# Patient Record
Sex: Female | Born: 1985 | Race: Asian | Hispanic: No | Marital: Married
Health system: Southern US, Community
[De-identification: ages and names within clinical notes are randomized; demographics above are authoritative.]

---

## 2014-01-30 ENCOUNTER — Other Ambulatory Visit (HOSPITAL_COMMUNITY)
Admission: RE | Admit: 2014-01-30 | Discharge: 2014-01-30 | Disposition: A | Discharge status: Home / Self Care | Payer: 59 | Source: Ambulatory Visit | Attending: Obstetrics & Gynecology | Admitting: Obstetrics & Gynecology

## 2014-01-30 DIAGNOSIS — Z01419 Encounter for gynecological examination (general) (routine) without abnormal findings: Secondary | ICD-10-CM | POA: Insufficient documentation

## 2014-01-30 DIAGNOSIS — Z113 Encounter for screening for infections with a predominantly sexual mode of transmission: Secondary | ICD-10-CM | POA: Insufficient documentation

## 2014-03-10 ENCOUNTER — Other Ambulatory Visit: Payer: Self-pay

## 2014-03-14 ENCOUNTER — Other Ambulatory Visit (HOSPITAL_COMMUNITY): Payer: Self-pay | Admitting: Obstetrics & Gynecology

## 2014-03-14 DIAGNOSIS — R772 Abnormality of alphafetoprotein: Secondary | ICD-10-CM

## 2014-03-23 ENCOUNTER — Ambulatory Visit (HOSPITAL_COMMUNITY)
Admission: RE | Admit: 2014-03-23 | Discharge: 2014-03-23 | Disposition: A | Discharge status: Home / Self Care | Payer: 59 | Source: Ambulatory Visit | Attending: Obstetrics & Gynecology | Admitting: Obstetrics & Gynecology

## 2014-03-23 ENCOUNTER — Encounter (HOSPITAL_COMMUNITY): Payer: Self-pay

## 2014-03-23 DIAGNOSIS — IMO0002 Reserved for concepts with insufficient information to code with codable children: Secondary | ICD-10-CM | POA: Insufficient documentation

## 2014-03-23 DIAGNOSIS — O351XX0 Maternal care for (suspected) chromosomal abnormality in fetus, not applicable or unspecified: Secondary | ICD-10-CM | POA: Insufficient documentation

## 2014-03-23 DIAGNOSIS — R772 Abnormality of alphafetoprotein: Secondary | ICD-10-CM

## 2014-03-23 DIAGNOSIS — O3510X0 Maternal care for (suspected) chromosomal abnormality in fetus, unspecified, not applicable or unspecified: Secondary | ICD-10-CM | POA: Insufficient documentation

## 2014-03-23 NOTE — Progress Notes (Signed)
Genetic Counseling  High-Risk Gestation Note  Appointment Date:  03/23/2014 Referred By: Sharon Sellerzan, Jennifer M, DO Date of Birth:  01-23-1986 Partner:  Elmer RampVishnu Prema    Pregnancy History: [id number] Estimated Date of Delivery: 08/28/14 Estimated Gestational Age: 6532w3d Attending: Particia NearingMartha Decker, MD   Ms. Keimya J[employer] Chase & Co[employer]riyadharshni Hellen and her husband, Mr. Elmer RampVishnu Prema, were seen for genetic counseling because of an increased risk for fetal Down syndrome based on Quad screen performed through [facility].  They were counseled regarding the Quad screen result and the associated 1 in 97 risk for fetal Down syndrome.  We reviewed chromosomes, nondisjunction, and the common features and variable prognosis of Down syndrome.  In addition, we reviewed the screen negative risks for trisomy 18 and ONTDs (1 in 10,000).  We also discussed other explanations for a screen positive result including: a gestational dating error, differences in maternal metabolism, and normal variation. They understand that this screening is not diagnostic for Down syndrome but provides a risk assessment.  Detailed ultrasound was performed at the time of today's visit. We reviewed that visualized fetal anatomy appeared normal. Complete ultrasound results reported separately. We discussed the benefits and limitations of ultrasound as a screening tool for Down syndrome.  They understand that ultrasound cannot diagnose or rule out Down syndrome or all chromosome/genetic conditions.   We reviewed available screening option of noninvasive prenatal screening (NIPS)/cell free DNA (cfDNA) testing.  They were counseled that screening tests are used to modify a patient's a priori risk for aneuploidy, typically based on age. This estimate provides a pregnancy specific risk assessment. We reviewed the benefits and limitations of each option. Specifically, we discussed the conditions for which NIPS screens, the detection rates, and false positive rates of each.  They were also counseled regarding diagnostic testing via amniocentesis. We reviewed the approximate 1 in 300-500 risk for complications for amniocentesis, including spontaneous pregnancy loss. After consideration of all the options, they declined NIPS and amniocentesis.  They understand that screening tests cannot rule out all birth defects or genetic syndromes.   Both family histories were reviewed and found to be noncontributory for birth defects, intellectual disability, recurrent pregnancy loss, and known genetic conditions. Without further information regarding the provided family history, an accurate genetic risk cannot be calculated. Further genetic counseling is warranted if more information is obtained.  Ms. Ralene OkRuma Priyadharshni Freimark denied exposure to environmental toxins or chemical agents. She denied the use of alcohol, tobacco or street drugs. She denied significant viral illnesses during the course of her pregnancy. Her medical and surgical histories were noncontributory.   I counseled this couple for approximately 40 minutes regarding the above risks and available options.   Quinn PlowmanKaren Roran Wegner, MS,  Certified Genetic Counselor 03/23/2014

## 2014-07-24 ENCOUNTER — Encounter (HOSPITAL_COMMUNITY): Payer: Self-pay

## 2015-01-26 ENCOUNTER — Encounter (HOSPITAL_COMMUNITY): Payer: Self-pay | Admitting: *Deleted

## 2015-07-30 IMAGING — US US OB DETAIL+14 WK
1 series · 12 of 28 positions shown · non-contrast
Comparison: none

[Series 1: us ob detail+14 wk · 0.18mm/px · 12 of 89 slices shown]
[im 4/89]
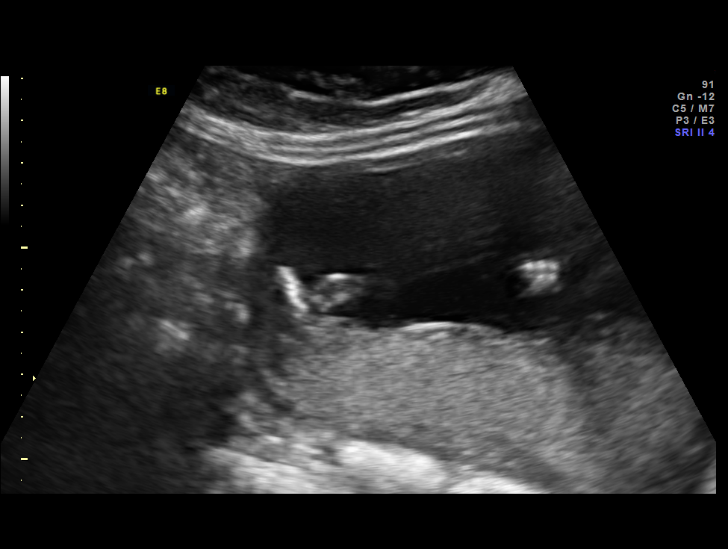
[im 10/89]
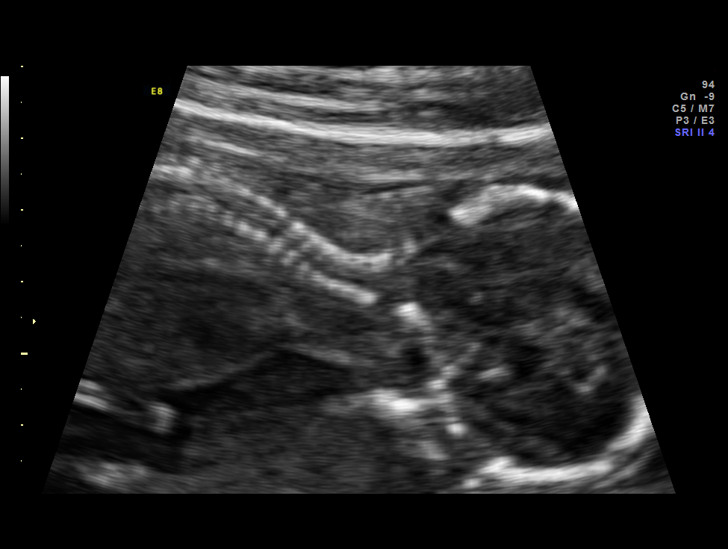
[im 17/89]
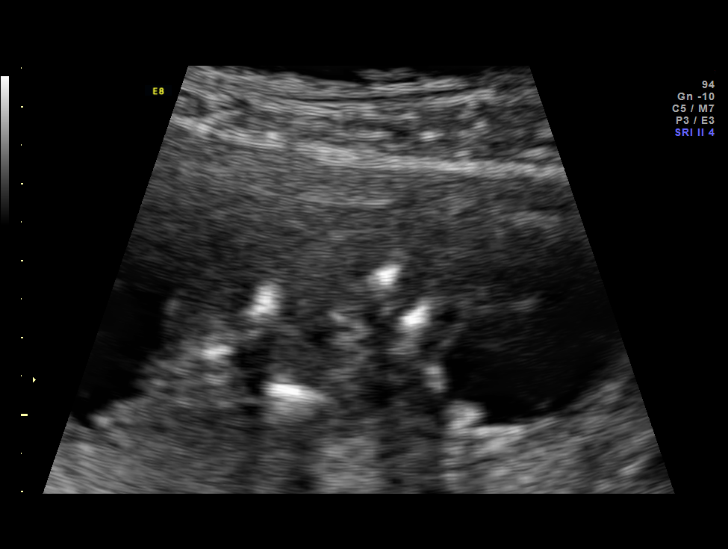
[im 27/89]
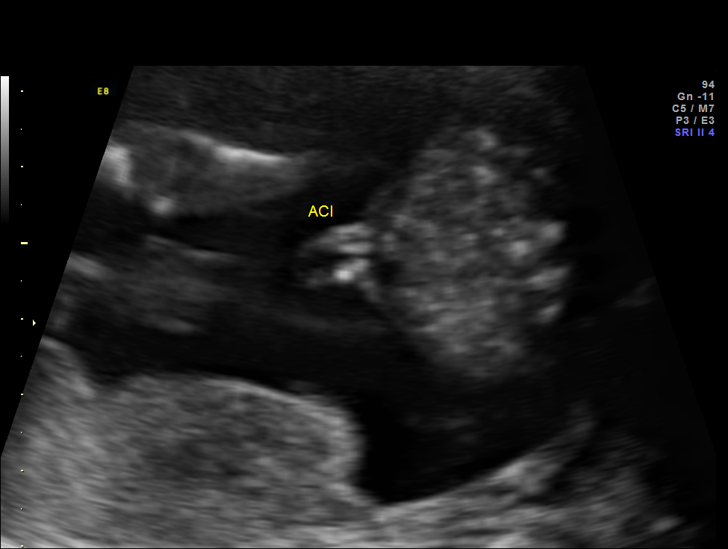
[im 33/89]
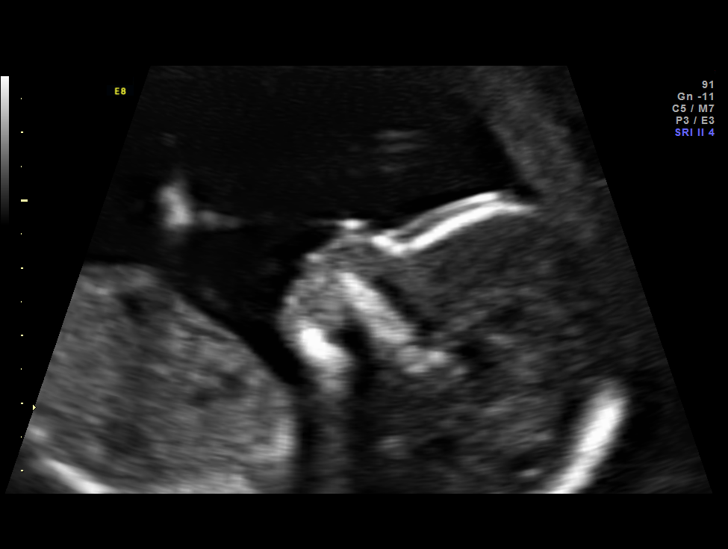
[im 40/89]
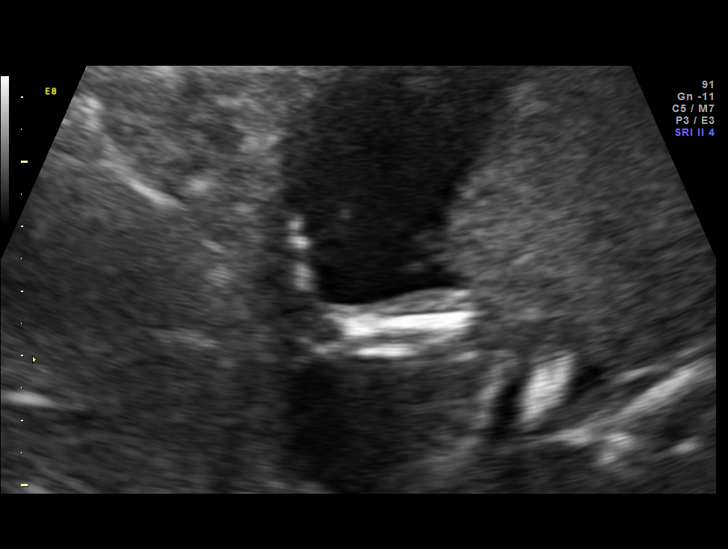
[im 49/89]
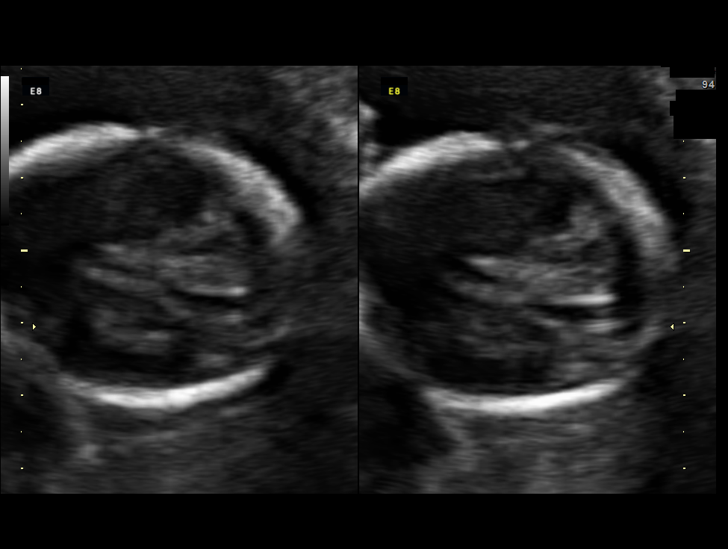
[im 56/89]
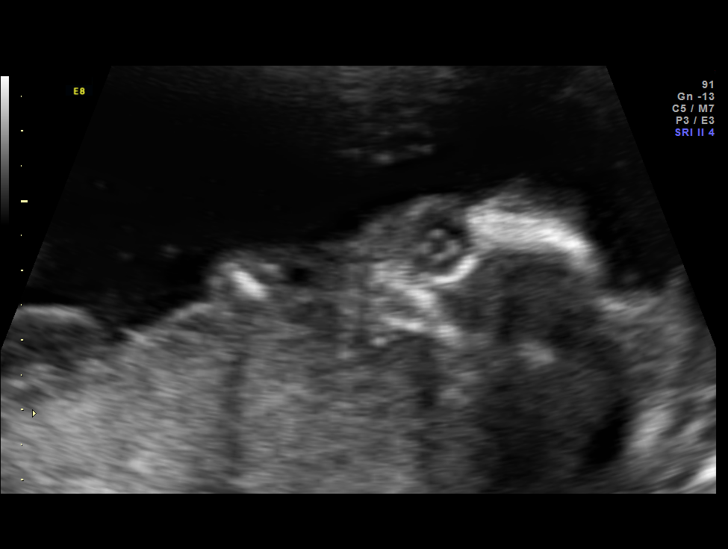
[im 62/89]
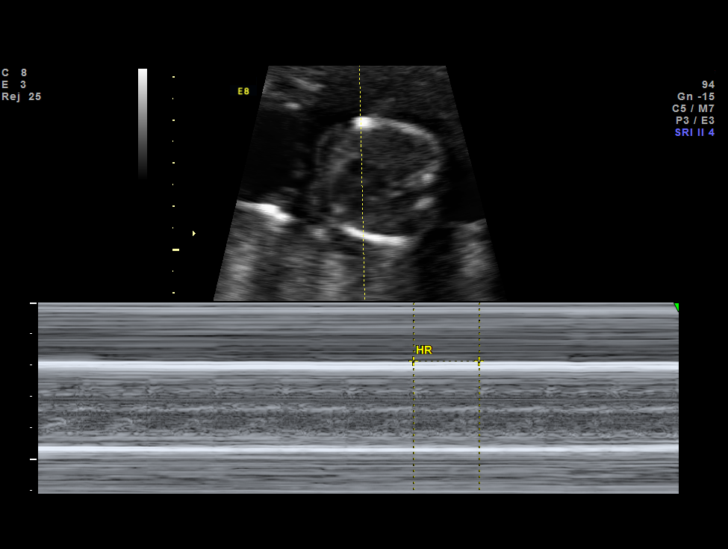
[im 72/89]
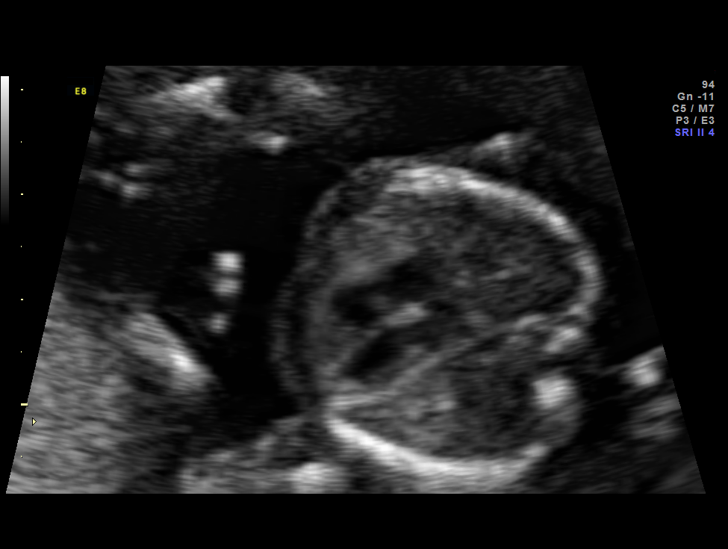
[im 79/89]
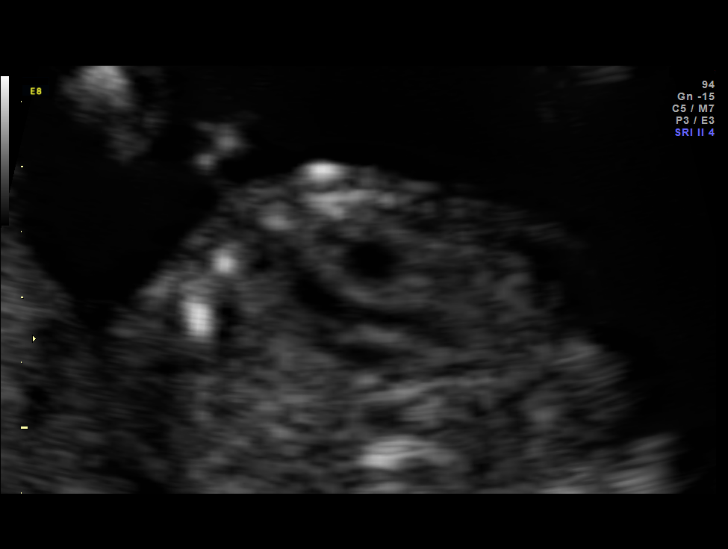
[im 85/89]
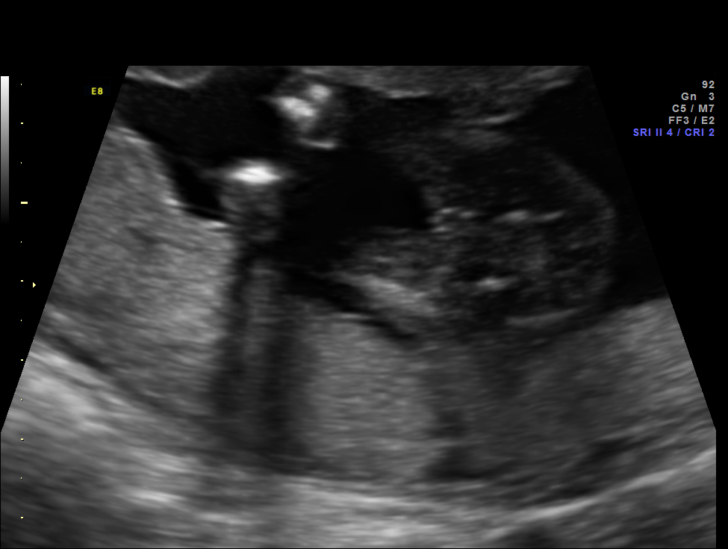

[12 of 28 positions shown; findings below may reference images not displayed]

OBSTETRICS REPORT
                      (Signed Final 03/23/2014 [DATE])

Service(s) Provided

 US OB DETAIL + 14 WK                                  76811.0
Indications

 Abnormal biochemical screen (quad) for Trisomy
 21 (1 in 97)
Fetal Evaluation

 Num Of Fetuses:    1
 Fetal Heart Rate:  153                          bpm
 Cardiac Activity:  Observed
 Presentation:      Cephalic
 Placenta:          Posterior, above cervical
                    os
 P. Cord            Visualized, central
 Insertion:

 Amniotic Fluid
 AFI FV:      Subjectively within normal limits
Biometry

 BPD:     38.2  mm     G. Age:  17w 5d                CI:         78.8   70 - 86
 OFD:     48.5  mm                                    FL/HC:      17.8   14.6 -

 HC:     139.6  mm     G. Age:  17w 2d       35  %    HC/AC:      1.24   1.07 -

 AC:     112.8  mm     G. Age:  17w 1d       38  %    FL/BPD:
 FL:      24.9  mm     G. Age:  17w 4d       49  %    FL/AC:      22.1   20 - 24
 HUM:     23.3  mm     G. Age:  17w 1d       50  %
 CER:     17.8  mm     G. Age:  17w 6d       57  %
 NFT:      2.8  mm

 Est. FW:     190  gm      0 lb 7 oz     50  %
Gestational Age

 LMP:           17w 3d        Date:  11/21/13                 EDD:   08/28/14
 U/S Today:     17w 3d                                        EDD:   08/28/14
 Best:          17w 3d     Det. By:  LMP  (11/21/13)          EDD:   08/28/14
Anatomy

 Cranium:          Appears normal         Aortic Arch:      Appears normal
 Fetal Cavum:      Appears normal         Ductal Arch:      Appears normal
 Ventricles:       Appears normal         Diaphragm:        Appears normal
 Choroid Plexus:   Heterogenous           Stomach:          Appears normal, left
                   choroids                                 sided
 Cerebellum:       Appears normal         Abdomen:          Appears normal
 Posterior Fossa:  Appears normal         Abdominal Wall:   Appears nml (cord
                                                            insert, abd wall)
 Nuchal Fold:      Appears normal         Cord Vessels:     Appears normal (3
                                                            vessel cord)
 Face:             Appears normal         Kidneys:          Appear normal
                   (orbits and profile)
 Lips:             Not well visualized    Bladder:          Appears normal
 Heart:            Appears normal         Spine:            Appears normal
                   (4CH, axis, and
                   situs)
 RVOT:             Appears normal         Lower             Appears normal
                                          Extremities:
 LVOT:             Appears normal         Upper             Appears normal
                                          Extremities:

 Other:  Female gender. Heels and 5th digit visualized. Nasal bone visualized.
Targeted Anatomy

 Fetal Central Nervous System
 Cisterna Magna:
Cervix Uterus Adnexa

 Cervical Length:    3.47     cm

 Cervix:       Normal appearance by transabdominal scan. 3

 Left Ovary:    Size(cm) L: 2.05 x W: 0.94 x H: 2.29  Volume(cc):
 Right Ovary:   No adnexal mass visualized.
Impression

 SIUP at 17+3 weeks
 Normal detailed fetal anatomy
 Markers of aneuploidy: none
 Normal amniotic fluid volume
 Measurements consistent with LMP dating

 The US findings were shared with Ms. Izakuba. The
 implications of a normal detailed US on her T21 risk were
 discussed in detail. All of her prenatal testing options were
 reviewed. After careful consideration, she declined
 amniocentesis and cell free DNA screening.
Recommendations

 Serial ultrasounds for growth
 Please see genetic counseling letter
